# Patient Record
Sex: Female | Born: 1995 | Race: Black or African American | Hispanic: No | Marital: Single | Smoking: Never smoker
Health system: Southern US, Community
[De-identification: ages and names within clinical notes are randomized; demographics above are authoritative.]

---

## 2019-03-19 ENCOUNTER — Ambulatory Visit: Payer: Self-pay | Admitting: Physician Assistant

## 2019-03-19 ENCOUNTER — Other Ambulatory Visit: Payer: Self-pay

## 2019-03-19 VITALS — BP 112/70 | HR 89 | Temp 98.7°F | Resp 16 | Wt 179.0 lb

## 2019-03-19 DIAGNOSIS — S1011XA Abrasion of throat, initial encounter: Secondary | ICD-10-CM

## 2019-03-19 MED ORDER — MAGIC MOUTHWASH W/LIDOCAINE: 5.0000 mL | Freq: Four times a day (QID) | ORAL | 0 refills | Status: AC

## 2019-03-19 NOTE — Patient Instructions (Addendum)
Thank you for choosing [facility] for your health care needs.  You have been diagnosed with a pharyngeal abrasion (scratched throat).  1. Abrasion of throat, initial encounter - magic mouthwash w/lidocaine SOLN; Take 5 mLs by mouth 4 (four) times daily for 7 days.  Dispense: 140 mL; Refill: 0  Use prescription mouthwash as prescribed. May take over the counter Tylenol or ibuprofen for pain/discomfort. Recommend soft food diet; bananas, yogurt, applesauce, soup, etc. For the next few days.  Follow-up with ED immediately if you develop difficulty breathing, difficulty swallowing/choking, worsening pain, or other new/concerning symptom.  Follow-up with ENT (earn, nose and throat specialist) in 4-5 days if symptoms not resolved.  Hope you feel better soon!   Pharyngitis  Pharyngitis is a sore throat (pharynx). This is when there is redness, pain, and swelling in your throat. Most of the time, this condition gets better on its own. In some cases, you may need medicine. Follow these instructions at home:  Take over-the-counter and prescription medicines only as told by your doctor. ? If you were prescribed an antibiotic medicine, take it as told by your doctor. Do not stop taking the antibiotic even if you start to feel better. ? Do not give children aspirin. Aspirin has been linked to Reye syndrome.  Drink enough water and fluids to keep your pee (urine) clear or pale yellow.  Get a lot of rest.  Rinse your mouth (gargle) with a salt-water mixture 3-4 times a day or as needed. To make a salt-water mixture, completely dissolve -1 tsp of salt in 1 cup of warm water.  If your doctor approves, you may use throat lozenges or sprays to soothe your throat. Contact a doctor if:  You have large, tender lumps in your neck.  You have a rash.  You cough up green, yellow-brown, or bloody spit. Get help right away if:  You have a stiff neck.  You drool or cannot swallow liquids.  You  cannot drink or take medicines without throwing up.  You have very bad pain that does not go away with medicine.  You have problems breathing, and it is not from a stuffy nose.  You have new pain and swelling in your knees, ankles, wrists, or elbows. Summary  Pharyngitis is a sore throat (pharynx). This is when there is redness, pain, and swelling in your throat.  If you were prescribed an antibiotic medicine, take it as told by your doctor. Do not stop taking the antibiotic even if you start to feel better.  Most of the time, pharyngitis gets better on its own. Sometimes, you may need medicine. This information is not intended to replace advice given to you by your health care provider. Make sure you discuss any questions you have with your health care provider. Document Released: 03/21/2008 Document Revised: 11/08/2016 Document Reviewed: 11/08/2016 Elsevier Interactive Patient Education  2019 [employer].

## 2019-03-19 NOTE — Progress Notes (Signed)
Patient ID: Lynn Guzman DOB: 1996-06-17 AGE: 23 y.o. MRN: [id number]   PCP: No primary care provider on file.   Chief Complaint:  Chief Complaint  Patient presents with  . Sore Throat    x1d     Subjective:    HPI:  Lynn Guzman is a 23 y.o. female presents for evaluation  Chief Complaint  Patient presents with  . Sore Throat    x36d    23 year old female presents to [facility] one day status post throat injury. Patient purchased food from [employer] in [place]. While eating, felt foreign body in throat. Coughed slightly. Patient coughed up piece of fingernail (picture visualized on patient's phone, was crescent shaped fingernail, like from someone cutting their fingernails). Was not whole finger nail; unlikely traumatic injury. No visible blood on fingernail. Fingernail was not acrylic. Patient reports continued irritated sensation in throat. Located midline. Aggravated with swallowing. Has not taken any OTC medication for symptom relief. Denies fever, chills, sweats, body aches, headache, URI symptoms, continued foreign body sensation, taste of blood in throat, difficulty swallowing food or liquid, cough, chest pain, chest discomfort, SOB, wheezing.  Patient typically employed at [employer]. Has not been working during [employer]. No known exposures. No recent travel.  Patient with no dysphagia history. No tobacco use history.  A limited review of symptoms was performed, pertinent positives and negatives as mentioned in HPI.  The following portions of the patient's history were reviewed and updated as appropriate: allergies, current medications and past medical history.  There are no active problems to display for this patient.   No Known Allergies  No current outpatient medications on file prior to visit.   No current facility-administered medications on file prior to visit.        Objective:   Vitals:   03/19/19 1716  BP: 112/70   Pulse: 89  Resp: 16  Temp: 98.7 F (37.1 C)  SpO2: 100%     Wt Readings from Last 3 Encounters:  03/19/19 179 lb (81.2 kg)    Physical Exam:   General Appearance:  Patient sitting comfortably on examination table. Conversational. Peri Jefferson self-historian. In no acute distress. Afebrile.   Head:  Normocephalic, without obvious abnormality, atraumatic  Eyes:  PERRL, conjunctiva/corneas clear, EOM's intact  Ears:  Left ear canal WNL. No erythema or edema. No open wound. No visible purulent drainage. No tenderness with palpation over left tragus or with manipulation of left auricle. No visible erythema or edema of left mastoid. No tenderness with palpation over left mastoid. Right ear canal WNL. No erythema or edema. No open wound. No visible purulent drainage. No tenderness with palpation over right tragus or with manipulation of right auricle. No visible erythema or edema of right mastoid. No tenderness with palpation over right mastoid. Left TM WNL. Good light reflex. Visible landmarks. No erythema. No injection. No bulging or retraction. No visible perforation. No serous effusion. No visible purulent effusion. No tympanostomy tube. No scar tissue. Right TM WNL. Good light reflex. Visible landmarks. No erythema. No injection. No bulging or retraction. No visible perforation. No serous effusion. No visible purulent effusion. No tympanostomy tube. No scar tissue.  Nose: Nares normal. Septum midline. No visible polyps. No discharge. Normal mucosa. No sinus tenderness with percussion/palpation.  Throat: Lips, mucosa, and tongue normal; teeth and gums normal. Throat reveals no erythema. No visible abrasion/scratch. No visible foreign body. No visible blood/dried blood. No change in voice. No postnasal drip. No visible cobblestoning. Tonsils  with no enlargement or exudate. Uvula midline with no edema or erythema. No stridor. No drooling. No hot potato/muffled voice.  Neck: Supple, symmetrical, trachea  midline, no adenopathy. No tenderness with palpation along neck.  Lungs:   Clear to auscultation bilaterally, respirations unlabored. Good aeration. No rales, rhonchi, crackles or wheezing.  Heart:  Regular rate and rhythm, S1 and S2 normal, no murmur, rub, or gallop  Extremities: Extremities normal, atraumatic, no cyanosis or edema  Pulses: 2+ and symmetric  Skin: Skin color, texture, turgor normal, no rashes or lesions  Lymph nodes: Cervical, supraclavicular, and axillary nodes normal  Neurologic: Normal    Assessment & Plan:    Exam findings, diagnosis etiology and medication use and indications reviewed with patient. Follow-Up and discharge instructions provided. No emergent/urgent issues found on exam.  Patient education was provided.   Patient verbalized understanding of information provided and agrees with plan of care (POC), all questions answered. The patient is advised to call or return to clinic if condition does not see an improvement in symptoms, or to seek the care of the closest emergency department if condition worsens with the below plan.    1. Abrasion of throat, initial encounter - magic mouthwash w/lidocaine SOLN; Take 5 mLs by mouth 4 (four) times daily for 7 days.  Dispense: 140 mL; Refill: 0   Patient one day status post pharyngeal foreign body. Patient coughed foreign body up, almost immediately. Suspect patient's lingering symptoms (odynophagia) secondary to pharyngeal abrasion. Foreign body: piece of fingernail. Discussed with patient; though Covid19 can live on surfaces, do not believe swallowing piece of fingernail increases her exposure risk. Advised continued symptom monitoring. Can have [facility] via free E-Visit if she develops symptoms. Discussed concern for blood bourne pathogen exposure; fingernail did not appear correlated with traumatic injury (appears to be fingernail clipping). Do not believe blood bourne pathogen testing  indicated. No significant wound; discussed tetanus vaccination update, patient declined at this time.   VSS. Afebrile. In no acute distress. No red flag symptoms; continued foreign body sensation, drooling, wheezing, stridor, dysphagia. Do not suspect continued/secondary foreign body, airway or esophageal perforation, or foreign body aspiration. Physical exam benign.  Patient comfortable with examination findings, diagnosis and treatment plan. Advised patient f/u immediately at the ED with worsening pain, difficulty swallowing, difficulty breathing, or other new/concerning symptom. Advised patient f/u with ENT in 4-5 days if symptoms not resolved.   Janalyn HarderSamantha Orpha Dain, MHS, PA-C Rulon SeraSamantha F. Yama Nielson, MHS, PA-C Advanced Practice Provider [facility]  [facility]  [phone] [place], [place] [address] (p): [phone] [email or web address]

## 2019-03-22 ENCOUNTER — Telehealth: Payer: Self-pay | Admitting: Emergency Medicine

## 2019-03-22 NOTE — Telephone Encounter (Signed)
Spoke with patient whom informed me that she is feeling so much better. This was a follow up call for visit with [facility].

## 2019-05-25 ENCOUNTER — Emergency Department
Admission: EM | Admit: 2019-05-25 | Discharge: 2019-05-25 | Disposition: A | Discharge status: Home / Self Care | Payer: No Typology Code available for payment source | Attending: Emergency Medicine | Admitting: Emergency Medicine

## 2019-05-25 ENCOUNTER — Emergency Department: Payer: No Typology Code available for payment source

## 2019-05-25 ENCOUNTER — Other Ambulatory Visit: Payer: Self-pay

## 2019-05-25 ENCOUNTER — Encounter: Payer: Self-pay | Admitting: Emergency Medicine

## 2019-05-25 DIAGNOSIS — Y999 Unspecified external cause status: Secondary | ICD-10-CM | POA: Diagnosis not present

## 2019-05-25 DIAGNOSIS — Y939 Activity, unspecified: Secondary | ICD-10-CM | POA: Diagnosis not present

## 2019-05-25 DIAGNOSIS — Y92410 Unspecified street and highway as the place of occurrence of the external cause: Secondary | ICD-10-CM | POA: Insufficient documentation

## 2019-05-25 DIAGNOSIS — S59902A Unspecified injury of left elbow, initial encounter: Secondary | ICD-10-CM | POA: Diagnosis present

## 2019-05-25 DIAGNOSIS — S59909A Unspecified injury of unspecified elbow, initial encounter: Secondary | ICD-10-CM
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
## 2019-05-25 NOTE — ED Notes (Signed)
Pt c/o left elbow pain.

## 2019-05-25 NOTE — Discharge Instructions (Addendum)
Follow-up with the orthopedic doctor if your elbow is not improving in 2 to 3 days.  Apply ice to the left elbow.  Take Tylenol or ibuprofen for pain as needed.  Wear the sling for 1 to 2 days.  Be sure to take your arm out move it around several times throughout the day so it will not get stiff.  Return emergency department if worsening.
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
## 2021-07-07 ENCOUNTER — Emergency Department
Admission: EM | Admit: 2021-07-07 | Discharge: 2021-07-07 | Disposition: A | Discharge status: Home / Self Care | Payer: Self-pay | Attending: Student in an Organized Health Care Education/Training Program | Admitting: Student in an Organized Health Care Education/Training Program

## 2021-07-07 ENCOUNTER — Emergency Department: Payer: Self-pay

## 2021-07-07 ENCOUNTER — Other Ambulatory Visit: Payer: Self-pay

## 2021-07-07 DIAGNOSIS — W1839XA Other fall on same level, initial encounter: Secondary | ICD-10-CM | POA: Insufficient documentation

## 2021-07-07 DIAGNOSIS — S52124A Nondisplaced fracture of head of right radius, initial encounter for closed fracture: Secondary | ICD-10-CM | POA: Insufficient documentation

## 2021-07-07 MED ORDER — OXYCODONE-ACETAMINOPHEN 5-325 MG PO TABS
1.0000 | ORAL_TABLET | Freq: Once | ORAL | Status: AC
  Administered 2021-07-07: 1 via ORAL
  Filled 2021-07-07: qty 1

## 2021-07-07 MED ORDER — OXYCODONE-ACETAMINOPHEN 5-325 MG PO TABS: 1.0000 | ORAL_TABLET | Freq: Four times a day (QID) | ORAL | 0 refills | Status: AC | PRN

## 2021-07-07 NOTE — ED Triage Notes (Signed)
Pt states she was on her sisters hoover board last night and fell on the concrete, having right elbow pain since.

## 2021-07-07 NOTE — ED Notes (Signed)
See triage note  presents s/p fall  states she fell from hoover board last pm  landed on right elbow  swelling noted  good pulses

## 2021-07-07 NOTE — ED Provider Notes (Signed)
[facility] Emergency Department Provider Note    Event Date/Time   First MD Initiated Contact with Patient 07/07/21 [phone]     (approximate)  I have reviewed the triage vital signs and the nursing notes.   HISTORY  Chief Complaint Elbow Pain    HPI Lynn Guzman is a 25 y.o. female injury to right elbow that occurred while she was playing with her family last night was playing on a hover board fell landing on the right elbow.  Did not hit her head.  No other associated injury.  Started having swelling and worsening pain so she came to the ER this morning.  History reviewed. No pertinent past medical history. No family history on file. History reviewed. No pertinent surgical history. There are no problems to display for this patient.     Prior to Admission medications   Medication Sig Start Date End Date Taking? Authorizing Provider  oxyCODONE-acetaminophen (PERCOCET) 5-325 MG tablet Take 1 tablet by mouth every 6 (six) hours as needed for severe pain. 07/07/21 07/07/22 Yes Willy Eddy, MD    Allergies Patient has no known allergies.    Social History Social History   Tobacco Use   Smoking status: Never   Smokeless tobacco: Never    Review of Systems Patient denies headaches, rhinorrhea, blurry vision, numbness, shortness of breath, chest pain, edema, cough, abdominal pain, nausea, vomiting, diarrhea, dysuria, fevers, rashes or hallucinations unless otherwise stated above in HPI. ____________________________________________   PHYSICAL EXAM:  VITAL SIGNS: Vitals:   07/07/21 0743  BP: 119/81  Pulse: 88  Resp: 16  Temp: 98 F (36.7 C)  SpO2: 99%    Constitutional: Alert and oriented.  Eyes: Conjunctivae are normal.  Head: Atraumatic. Nose: No congestion/rhinnorhea. Mouth/Throat: Mucous membranes are moist.   Neck: No stridor. Painless ROM.  Cardiovascular: Normal rate, regular rhythm. Grossly normal heart sounds.  Good  peripheral circulation. Respiratory: Normal respiratory effort.  No retractions. Lungs CTAB. Gastrointestinal: Soft and nontender. No distention. No abdominal bruits. No CVA tenderness. Genitourinary:  Musculoskeletal: No lower extremity tenderness nor edema.  Right elbow with some swelling no laceration pain with palpation of the proximal radius no pain over the epicondyles.  No distal pain or proximal pain.  Neurovascular intact. Neurologic:  Normal speech and language. No gross focal neurologic deficits are appreciated. No facial droop Skin:  Skin is warm, dry and intact. No rash noted. Psychiatric: Mood and affect are normal. Speech and behavior are normal.  ____________________________________________   LABS (all labs ordered are listed, but only abnormal results are displayed)  No results found for this or any previous visit (from the past 24 hour(s)). ____________________________________________ ____________________________________________  RADIOLOGY  I personally reviewed all radiographic images ordered to evaluate for the above acute complaints and reviewed radiology reports and findings.  These findings were personally discussed with the patient.  Please see medical record for radiology report.  ____________________________________________   PROCEDURES  Procedure(s) performed:  .Ortho Injury Treatment  Date/Time: 07/07/2021 9:43 AM Performed by: Willy Eddy, MD Authorized by: Willy Eddy, MD   Consent:    Consent obtained:  Harvie Heck location: elbow Location details: right elbow Injury type: fracture Fracture type: radial head Pre-procedure neurovascular assessment: neurovascularly intact Manipulation performed: no Immobilization: splint Splint type: long arm Splint Applied by: ED Provider Supplies used: Ortho-Glass Post-procedure neurovascular assessment: post-procedure neurovascularly intact      Critical Care performed:  no ____________________________________________   INITIAL IMPRESSION / ASSESSMENT AND PLAN / ED COURSE  Pertinent labs & imaging results that were available during my care of the patient were reviewed by me and considered in my medical decision making (see chart for details).   DDX: fracture, contusion, dislocation  Lynn Guzman is a 25 y.o. who presents to the ED with injury to right elbow with evidence of fracture of right radial head not dislocated or significantly displaced.  Neurovascular intact.  Splint placed.  Will give follow-up with Ortho.  Have discussed with the patient and available family all diagnostics and treatments performed thus far and all questions were answered to the best of my ability. The patient demonstrates understanding and agreement with plan.      The patient was evaluated in Emergency Department today for the symptoms described in the history of present illness. He/she was evaluated in the context of the global COVID-19 pandemic, which necessitated consideration that the patient might be at risk for infection with the SARS-CoV-2 virus that causes COVID-19. Institutional protocols and algorithms that pertain to the evaluation of patients at risk for COVID-19 are in a state of rapid change based on information released by regulatory bodies including the CDC and federal and state organizations. These policies and algorithms were followed during the patient's care in the ED.  As part of my medical decision making, I reviewed the following data within the electronic MEDICAL RECORD NUMBER Nursing notes reviewed and incorporated, Labs reviewed, notes from prior ED visits and [place] Controlled Substance Database   ____________________________________________   FINAL CLINICAL IMPRESSION(S) / ED DIAGNOSES  Final diagnoses:  Closed nondisplaced fracture of head of right radius, initial encounter      NEW MEDICATIONS STARTED DURING THIS VISIT:  New Prescriptions    OXYCODONE-ACETAMINOPHEN (PERCOCET) 5-325 MG TABLET    Take 1 tablet by mouth every 6 (six) hours as needed for severe pain.     Note:  This document was prepared using Dragon voice recognition software and may include unintentional dictation errors.    Willy Eddy, MD 07/07/21 [phone]
